# Patient Record
Sex: Male | Born: 1980 | Race: Black or African American | Hispanic: No | Marital: Single | Smoking: Current every day smoker
Health system: Southern US, Community
[De-identification: ages and names within clinical notes are randomized; demographics above are authoritative.]

## PROBLEM LIST (undated history)

## (undated) HISTORY — PX: APPENDECTOMY: SHX54

---

## 2006-01-02 ENCOUNTER — Emergency Department: Payer: Self-pay

## 2010-05-01 ENCOUNTER — Emergency Department: Payer: Self-pay | Admitting: Emergency Medicine

## 2013-06-08 ENCOUNTER — Emergency Department: Payer: Self-pay | Admitting: Emergency Medicine

## 2013-06-18 ENCOUNTER — Emergency Department: Payer: Self-pay

## 2015-07-05 ENCOUNTER — Other Ambulatory Visit: Payer: Self-pay | Admitting: Family Medicine

## 2015-07-05 ENCOUNTER — Ambulatory Visit
Admission: RE | Admit: 2015-07-05 | Discharge: 2015-07-05 | Disposition: A | Discharge status: Home / Self Care | Payer: Disability Insurance | Source: Ambulatory Visit | Attending: Family Medicine | Admitting: Family Medicine

## 2015-07-05 DIAGNOSIS — M545 Low back pain: Secondary | ICD-10-CM

## 2018-10-17 ENCOUNTER — Other Ambulatory Visit: Payer: Self-pay

## 2018-10-17 ENCOUNTER — Emergency Department
Admission: EM | Admit: 2018-10-17 | Discharge: 2018-10-17 | Disposition: A | Discharge status: Home / Self Care | Payer: Disability Insurance | Attending: Emergency Medicine | Admitting: Emergency Medicine

## 2018-10-17 DIAGNOSIS — Z5189 Encounter for other specified aftercare: Secondary | ICD-10-CM

## 2018-10-17 DIAGNOSIS — Z029 Encounter for administrative examinations, unspecified: Secondary | ICD-10-CM | POA: Insufficient documentation

## 2018-10-17 MED ORDER — MUPIROCIN 2 % EX OINT: TOPICAL_OINTMENT | CUTANEOUS | 0 refills | Status: AC

## 2018-10-17 NOTE — ED Notes (Signed)
Pt informed triage RN multiple times that he needs to make sure he has a work note for tomorrow.

## 2018-10-17 NOTE — ED Provider Notes (Signed)
[facility] Emergency Department Provider Note  ____________________________________________  Time seen: Approximately 11:40 PM  I have reviewed the triage vital signs and the nursing notes.   HISTORY  Chief Complaint Laceration    HPI Danny Herrera is a 38 y.o. male presents to the emergency department with a fissure in the skin between the left fourth and fifth toes.  Patient reports that he "stepped on something" while getting ready in the bathroom this morning.  Patient states that it is been irritating him and has been difficult to stand at his place of work, [employer].  Patient states he would like a work note.        No past medical history on file.  There are no active problems to display for this patient.   Prior to Admission medications   Medication Sig Start Date End Date Taking? Authorizing Provider  mupirocin ointment (BACTROBAN) 2 % Apply to affected area 3 times daily 10/17/18 10/17/19  Orvil FeilWoods, Nevelyn Mellott M, PA-C    Allergies Patient has no known allergies.  No family history on file.  Social History Social History   Tobacco Use  . Smoking status: Not on file  Substance Use Topics  . Alcohol use: Not on file  . Drug use: Not on file     Review of Systems  Constitutional: No fever/chills Eyes: No visual changes. No discharge ENT: No upper respiratory complaints. Cardiovascular: no chest pain. Respiratory: no cough. No SOB. Gastrointestinal: No abdominal pain.  No nausea, no vomiting.  No diarrhea.  No constipation. Musculoskeletal: Negative for musculoskeletal pain. Skin: Patient has fissure between left fourth and fifth toes. Neurological: Negative for headaches, focal weakness or numbness.   ____________________________________________   PHYSICAL EXAM:  VITAL SIGNS: ED Triage Vitals  Enc Vitals Group     BP 10/17/18 2155 107/67     Pulse Rate 10/17/18 2155 87     Resp 10/17/18 2155 16     Temp 10/17/18 2155 98.7 F  (37.1 C)     Temp Source 10/17/18 2155 Oral     SpO2 10/17/18 2155 96 %     Weight 10/17/18 2155 165 lb (74.8 kg)     Height 10/17/18 2155 5\' 6"  (1.676 m)     Head Circumference --      Peak Flow --      Pain Score 10/17/18 2156 8     Pain Loc --      Pain Edu? --      Excl. in [place]? --      Constitutional: Alert and oriented. Well appearing and in no acute distress. Eyes: Conjunctivae are normal. PERRL. EOMI. Head: Atraumatic. Cardiovascular: Normal rate, regular rhythm. Normal S1 and S2.  Good peripheral circulation. Respiratory: Normal respiratory effort without tachypnea or retractions. Lungs CTAB. Good air entry to the bases with no decreased or absent breath sounds. Musculoskeletal: Full range of motion to all extremities. No gross deformities appreciated. Neurologic:  Normal speech and language. No gross focal neurologic deficits are appreciated.  Skin: Patient has fissure between left fourth and fifth toes. Psychiatric: Mood and affect are normal. Speech and behavior are normal. Patient exhibits appropriate insight and judgement.   ____________________________________________   LABS (all labs ordered are listed, but only abnormal results are displayed)  Labs Reviewed - No data to display ____________________________________________  EKG   ____________________________________________  RADIOLOGY   No results found.  ____________________________________________    PROCEDURES  Procedure(s) performed:    Procedures  Medications - No data to display   ____________________________________________   INITIAL IMPRESSION / ASSESSMENT AND PLAN / ED COURSE  Pertinent labs & imaging results that were available during my care of the patient were reviewed by me and considered in my medical decision making (see chart for details).  Review of the [place] CSRS was performed in accordance of the [place] prior to dispensing any controlled drugs.            Assessment and plan Wound check Patient presents to the emergency department for wound check between left fourth and fifth toes.  A small fissure is visualized on physical exam between left fourth and fifth toes.  Patient was prescribed mupirocin ointment and given work note per his request.  All patient questions were answered.  ____________________________________________  FINAL CLINICAL IMPRESSION(S) / ED DIAGNOSES  Final diagnoses:  Visit for wound check      NEW MEDICATIONS STARTED DURING THIS VISIT:  ED Discharge Orders         Ordered    mupirocin ointment (BACTROBAN) 2 %     10/17/18 2324              This chart was dictated using voice recognition software/Dragon. Despite best efforts to proofread, errors can occur which can change the meaning. Any change was purely unintentional.    Lannie Fields, PA-C 10/17/18 2344    Nance Pear, MD 10/18/18 [phone]

## 2018-10-17 NOTE — ED Triage Notes (Signed)
Pt complains of left foot "laceration". Pt has what appears to be an open blister or callous on left foot at base of left fifth toe.

## 2018-11-07 ENCOUNTER — Other Ambulatory Visit: Payer: Self-pay

## 2018-11-07 ENCOUNTER — Emergency Department
Admission: EM | Admit: 2018-11-07 | Discharge: 2018-11-07 | Discharge status: Against Medical Advice | Payer: Disability Insurance

## 2019-04-18 ENCOUNTER — Emergency Department
Admission: EM | Admit: 2019-04-18 | Discharge: 2019-04-19 | Disposition: A | Discharge status: Home / Self Care | Payer: PRIVATE HEALTH INSURANCE | Attending: Emergency Medicine | Admitting: Emergency Medicine

## 2019-04-18 ENCOUNTER — Encounter: Payer: Self-pay | Admitting: Emergency Medicine

## 2019-04-18 ENCOUNTER — Emergency Department: Payer: PRIVATE HEALTH INSURANCE

## 2019-04-18 ENCOUNTER — Other Ambulatory Visit: Payer: Self-pay

## 2019-04-18 DIAGNOSIS — S46812A Strain of other muscles, fascia and tendons at shoulder and upper arm level, left arm, initial encounter: Secondary | ICD-10-CM | POA: Diagnosis not present

## 2019-04-18 DIAGNOSIS — Y990 Civilian activity done for income or pay: Secondary | ICD-10-CM | POA: Insufficient documentation

## 2019-04-18 DIAGNOSIS — Y93G1 Activity, food preparation and clean up: Secondary | ICD-10-CM | POA: Insufficient documentation

## 2019-04-18 DIAGNOSIS — Y9259 Other trade areas as the place of occurrence of the external cause: Secondary | ICD-10-CM | POA: Diagnosis not present

## 2019-04-18 DIAGNOSIS — F17210 Nicotine dependence, cigarettes, uncomplicated: Secondary | ICD-10-CM | POA: Diagnosis not present

## 2019-04-18 DIAGNOSIS — X500XXA Overexertion from strenuous movement or load, initial encounter: Secondary | ICD-10-CM | POA: Insufficient documentation

## 2019-04-18 DIAGNOSIS — M542 Cervicalgia: Secondary | ICD-10-CM | POA: Insufficient documentation

## 2019-04-18 DIAGNOSIS — S4992XA Unspecified injury of left shoulder and upper arm, initial encounter: Secondary | ICD-10-CM | POA: Diagnosis present

## 2019-04-18 DIAGNOSIS — T148XXA Other injury of unspecified body region, initial encounter: Secondary | ICD-10-CM

## 2019-04-18 MED ORDER — METHOCARBAMOL 500 MG PO TABS
500.0000 mg | ORAL_TABLET | Freq: Once | ORAL | Status: AC
  Administered 2019-04-19: 500 mg via ORAL
  Filled 2019-04-18: qty 1

## 2019-04-18 MED ORDER — METHOCARBAMOL 500 MG PO TABS: 1000.0000 mg | ORAL_TABLET | Freq: Once | ORAL | Status: DC

## 2019-04-18 MED ORDER — KETOROLAC TROMETHAMINE 30 MG/ML IJ SOLN
30.0000 mg | Freq: Once | INTRAMUSCULAR | Status: AC
  Administered 2019-04-18: 30 mg via INTRAMUSCULAR
  Filled 2019-04-18: qty 1

## 2019-04-18 MED ORDER — IBUPROFEN 600 MG PO TABS: 600.0000 mg | ORAL_TABLET | Freq: Four times a day (QID) | ORAL | 0 refills | Status: DC | PRN

## 2019-04-18 MED ORDER — CYCLOBENZAPRINE HCL 5 MG PO TABS: ORAL_TABLET | ORAL | 0 refills | Status: DC

## 2019-04-18 MED ORDER — ORPHENADRINE CITRATE 30 MG/ML IJ SOLN: 60.0000 mg | Freq: Two times a day (BID) | INTRAMUSCULAR | Status: DC

## 2019-04-18 MED ORDER — OXYCODONE-ACETAMINOPHEN 5-325 MG PO TABS
1.0000 | ORAL_TABLET | Freq: Once | ORAL | Status: AC
  Administered 2019-04-19: 1 via ORAL
  Filled 2019-04-18: qty 1

## 2019-04-18 MED ORDER — LIDOCAINE 5 % EX PTCH
1.0000 | MEDICATED_PATCH | CUTANEOUS | Status: DC
  Administered 2019-04-18: 23:00:00 1 via TRANSDERMAL
  Filled 2019-04-18: qty 1

## 2019-04-18 MED ORDER — LIDOCAINE 5 % EX PTCH: 1.0000 | MEDICATED_PATCH | CUTANEOUS | 0 refills | Status: DC

## 2019-04-18 MED ORDER — TRAMADOL HCL 50 MG PO TABS: 50.0000 mg | ORAL_TABLET | Freq: Three times a day (TID) | ORAL | 0 refills | Status: AC | PRN

## 2019-04-18 NOTE — ED Notes (Signed)
This tech was in the process of workers comp paper work. Pt went to bathroom to provide urine sample and brought it back to this tech in which the thermometer strip on the specimen cup did not change colors and urine felt cold. Rebecca,RN looked at specimen cup and saw the same findings as this tech. Pt made aware that we could not accept that urine specimen and that we would need to restart the process. Pt being taken to XR at this time.

## 2019-04-18 NOTE — ED Notes (Signed)
Pt informed RN that he was ready to repeat WC. This tech went to pt rm and pt stated "Can I actually have a cup of water? I am trying to work myself up to be able to pee." Pt provided with a cup of water per request. Pt also made aware that this time someone will have to stay in the bathroom to observe him urinating d/t results from first urine specimen. Pt stated "that is fine." Will inform oncoming tech of situation.

## 2019-04-18 NOTE — ED Triage Notes (Signed)
Pt reports he was at work lifted a box approximately 65lbs reports was able to lift and move to a different area after this reports he started to have left clavicle and shoulder pain. Pt talks in complete sentences no distress noted.

## 2019-04-18 NOTE — ED Provider Notes (Signed)
[facility] Emergency Department Provider Note  ____________________________________________  Time seen: Approximately 10:56 PM  I have reviewed the triage vital signs and the nursing notes.   HISTORY  Chief Complaint Shoulder Pain and Clavicle Injury    HPI Danny Herrera is a 39 y.o. male that presents to the emergency department for evaluation of left-sided neck pain tonight.  Patient states that he was at work and working [employer] today.  Patient states that he has the exact same routine every day.  He was cleaning up around 7 PM and was cleaning the crumbs out of the fry machine.  He lifted about 65 pounds and felt a sharp pain to the left side of his neck "where his neck meets his shoulder."  Right after the injury, he did feel some dizziness but this resolved shortly after.  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  No headache, visual changes, shortness of breath.  History reviewed. No pertinent past medical history.  There are no problems to display for this patient.   Past Surgical History:  Procedure Laterality Date  . APPENDECTOMY      Prior to Admission medications   Medication Sig Start Date End Date Taking? Authorizing Provider  cyclobenzaprine (FLEXERIL) 5 MG tablet Take 1-2 tablets 3 times daily as needed 04/18/19   Laban Emperor, PA-C  ibuprofen (ADVIL) 600 MG tablet Take 1 tablet (600 mg total) by mouth every 6 (six) hours as needed. 04/18/19   Laban Emperor, PA-C  lidocaine (LIDODERM) 5 % Place 1 patch onto the skin daily. Remove & Discard patch within 12 hours or as directed by MD 04/18/19   Laban Emperor, PA-C  mupirocin ointment Drue Stager) 2 % Apply to affected area 3 times daily 10/17/18 10/17/19  Lannie Fields, PA-C  traMADol (ULTRAM) 50 MG tablet Take 1 tablet (50 mg total) by mouth every 8 (eight) hours as needed for up to 2 days. 04/18/19 04/20/19  Laban Emperor, PA-C    Allergies Patient has no known  allergies.  No family history on file.  Social History Social History   Tobacco Use  . Smoking status: Current Every Day Smoker  Substance Use Topics  . Alcohol use: Yes  . Drug use: Not Currently     Review of Systems  Cardiovascular: No chest pain. Respiratory: No cough. No SOB. Gastrointestinal: No abdominal pain.  No nausea, no vomiting.  Musculoskeletal: Positive for neck/shoulder pain. Skin: Negative for rash, abrasions, lacerations, ecchymosis. Neurological: Negative for headaches   ____________________________________________   PHYSICAL EXAM:  VITAL SIGNS: ED Triage Vitals  Enc Vitals Group     BP 04/18/19 2058 (!) 119/92     Pulse Rate 04/18/19 2058 90     Resp 04/18/19 2058 20     Temp 04/18/19 2058 98 F (36.7 C)     Temp Source 04/18/19 2058 Oral     SpO2 --      Weight 04/18/19 2059 165 lb (74.8 kg)     Height 04/18/19 2059 5\' 6"  (1.676 m)     Head Circumference --      Peak Flow --      Pain Score 04/18/19 2058 10     Pain Loc --      Pain Edu? --      Excl. in [place]? --      Constitutional: Alert and oriented. Well appearing and in no acute distress. Eyes: Conjunctivae are normal. PERRL. EOMI. Head:  Atraumatic. ENT:      Ears:      Nose: No congestion/rhinnorhea.      Mouth/Throat: Mucous membranes are moist.  Neck: No stridor. No cervical spine tenderness to palpation.  No tenderness to palpation to left or right trapezius. Cardiovascular: Normal rate, regular rhythm.  Good peripheral circulation.  Symmetric radial pulses bilaterally. Respiratory: Normal respiratory effort without tachypnea or retractions. Lungs CTAB. Good air entry to the bases with no decreased or absent breath sounds. Musculoskeletal: No gross deformities appreciated.  No tenderness to palpation to [facility] joint. Point tenderness to palpation superior to mid clavicle. Pain with right rotation of neck. Limited ROM of right shoulder due to pain.  Neurologic:  Normal speech and  language. No gross focal neurologic deficits are appreciated.  Skin:  Skin is warm, dry and intact. No rash noted. Psychiatric: Mood and affect are normal. Speech and behavior are normal. Patient exhibits appropriate insight and judgement.   ____________________________________________   LABS (all labs ordered are listed, but only abnormal results are displayed)  Labs Reviewed - No data to display ____________________________________________  EKG   ____________________________________________  RADIOLOGY Lexine Baton, personally viewed and evaluated these images (plain radiographs) as part of my medical decision making, as well as reviewing the written report by the radiologist.  DG Shoulder Left  Result Date: 04/18/2019 CLINICAL DATA:  Left shoulder pain after lifting EXAM: LEFT SHOULDER - 2+ VIEW COMPARISON:  None. FINDINGS: There is no evidence of fracture or dislocation. There is no evidence of arthropathy or other focal bone abnormality. Soft tissues are unremarkable. IMPRESSION: Negative. Electronically Signed   By: Jasmine Pang M.D.   On: 04/18/2019 21:31    ____________________________________________    PROCEDURES  Procedure(s) performed:    Procedures    Medications  lidocaine (LIDODERM) 5 % 1 patch (1 patch Transdermal Patch Applied 04/18/19 2308)  methocarbamol (ROBAXIN) tablet 500 mg (has no administration in time range)  oxyCODONE-acetaminophen (PERCOCET/ROXICET) 5-325 MG per tablet 1 tablet (has no administration in time range)  ketorolac (TORADOL) 30 MG/ML injection 30 mg (30 mg Intramuscular Given 04/18/19 2310)     ____________________________________________   INITIAL IMPRESSION / ASSESSMENT AND PLAN / ED COURSE  Pertinent labs & imaging results that were available during my care of the patient were reviewed by me and considered in my medical decision making (see chart for details).  Review of the [place] CSRS was performed in accordance of the [facility]  prior to dispensing any controlled drugs.   Patient's diagnosis is consistent with musculoskeletal injury. Xray negative for acute bony abnormalities. Pain significantly improved with pain medications.  Patient is now able to move around his neck and his left shoulder with minimal pain.  Patient will be discharged home with prescriptions for flexeril, motrin, and a short course of tramadol. Patient is to follow up with PCP as directed. Referal was given. Patient is given ED precautions to return to the ED for any worsening or new symptoms.  Danny Herrera was evaluated in Emergency Department on 04/18/2019 for the symptoms described in the history of present illness. He was evaluated in the context of the global COVID-19 pandemic, which necessitated consideration that the patient might be at risk for infection with the SARS-CoV-2 virus that causes COVID-19. Institutional protocols and algorithms that pertain to the evaluation of patients at risk for COVID-19 are in a state of rapid change based on information released by regulatory bodies including the CDC and federal and state organizations. These  policies and algorithms were followed during the patient's care in the ED.   ____________________________________________  FINAL CLINICAL IMPRESSION(S) / ED DIAGNOSES  Final diagnoses:  Muscle strain      NEW MEDICATIONS STARTED DURING THIS VISIT:  ED Discharge Orders         Ordered    ibuprofen (ADVIL) 600 MG tablet  Every 6 hours PRN     04/18/19 2329    cyclobenzaprine (FLEXERIL) 5 MG tablet     04/18/19 2329    lidocaine (LIDODERM) 5 %  Every 24 hours     04/18/19 2329    traMADol (ULTRAM) 50 MG tablet  Every 8 hours PRN     04/18/19 2329              This chart was dictated using voice recognition software/Dragon. Despite best efforts to proofread, errors can occur which can change the meaning. Any change was purely unintentional.    Enid Derry, PA-C 04/18/19 2331     Emily Filbert, MD 04/18/19 2352

## 2019-04-19 NOTE — ED Notes (Signed)
Attempted to get urine sample for WC. PT unable to provide second sample after. PT signed paperwork giving consent and also stating that he understands the specimen and lack thereof second specimen did not meet testing criteria. Witnessed by [place], [place]. PT discharged.

## 2019-07-11 ENCOUNTER — Emergency Department: Payer: Medicaid Other

## 2019-07-11 ENCOUNTER — Encounter: Payer: Self-pay | Admitting: Emergency Medicine

## 2019-07-11 ENCOUNTER — Other Ambulatory Visit: Payer: Self-pay

## 2019-07-11 ENCOUNTER — Emergency Department
Admission: EM | Admit: 2019-07-11 | Discharge: 2019-07-11 | Disposition: A | Discharge status: Home / Self Care | Payer: Medicaid Other | Attending: Emergency Medicine | Admitting: Emergency Medicine

## 2019-07-11 DIAGNOSIS — Y939 Activity, unspecified: Secondary | ICD-10-CM | POA: Insufficient documentation

## 2019-07-11 DIAGNOSIS — W01110A Fall on same level from slipping, tripping and stumbling with subsequent striking against sharp glass, initial encounter: Secondary | ICD-10-CM | POA: Insufficient documentation

## 2019-07-11 DIAGNOSIS — Y999 Unspecified external cause status: Secondary | ICD-10-CM | POA: Insufficient documentation

## 2019-07-11 DIAGNOSIS — Z79899 Other long term (current) drug therapy: Secondary | ICD-10-CM | POA: Insufficient documentation

## 2019-07-11 DIAGNOSIS — Y929 Unspecified place or not applicable: Secondary | ICD-10-CM | POA: Insufficient documentation

## 2019-07-11 DIAGNOSIS — R109 Unspecified abdominal pain: Secondary | ICD-10-CM | POA: Insufficient documentation

## 2019-07-11 DIAGNOSIS — F17210 Nicotine dependence, cigarettes, uncomplicated: Secondary | ICD-10-CM | POA: Insufficient documentation

## 2019-07-11 DIAGNOSIS — S31119A Laceration without foreign body of abdominal wall, unspecified quadrant without penetration into peritoneal cavity, initial encounter: Secondary | ICD-10-CM

## 2019-07-11 LAB — CBC WITH DIFFERENTIAL/PLATELET
Abs Immature Granulocytes: 0.02 10*3/uL (ref 0.00–0.07)
Basophils Absolute: 0 10*3/uL (ref 0.0–0.1)
Basophils Relative: 0 %
Eosinophils Absolute: 0.1 10*3/uL (ref 0.0–0.5)
Eosinophils Relative: 2 %
HCT: 41.6 % (ref 39.0–52.0)
Hemoglobin: 14.1 g/dL (ref 13.0–17.0)
Immature Granulocytes: 0 %
Lymphocytes Relative: 29 %
Lymphs Abs: 2.3 10*3/uL (ref 0.7–4.0)
MCH: 31.4 pg (ref 26.0–34.0)
MCHC: 33.9 g/dL (ref 30.0–36.0)
MCV: 92.7 fL (ref 80.0–100.0)
Monocytes Absolute: 0.5 10*3/uL (ref 0.1–1.0)
Monocytes Relative: 6 %
Neutro Abs: 4.7 10*3/uL (ref 1.7–7.7)
Neutrophils Relative %: 63 %
Platelets: 325 10*3/uL (ref 150–400)
RBC: 4.49 MIL/uL (ref 4.22–5.81)
RDW: 13.5 % (ref 11.5–15.5)
WBC: 7.7 10*3/uL (ref 4.0–10.5)
nRBC: 0 % (ref 0.0–0.2)

## 2019-07-11 LAB — BASIC METABOLIC PANEL
Anion gap: 10 (ref 5–15)
BUN: 14 mg/dL (ref 6–20)
CO2: 21 mmol/L — ABNORMAL LOW (ref 22–32)
Calcium: 9 mg/dL (ref 8.9–10.3)
Chloride: 105 mmol/L (ref 98–111)
Creatinine, Ser: 0.9 mg/dL (ref 0.61–1.24)
GFR calc Af Amer: >60 mL/min (ref >60)
GFR calc non Af Amer: >60 mL/min (ref >60)
Glucose, Bld: 94 mg/dL (ref 70–99)
Potassium: 3.6 mmol/L (ref 3.5–5.1)
Sodium: 136 mmol/L (ref 135–145)

## 2019-07-11 MED ORDER — IOHEXOL 300 MG/ML  SOLN
100.0000 mL | Freq: Once | INTRAMUSCULAR | Status: AC | PRN
  Administered 2019-07-11: 100 mL via INTRAVENOUS

## 2019-07-11 MED ORDER — FENTANYL CITRATE (PF) 100 MCG/2ML IJ SOLN
50.0000 ug | Freq: Once | INTRAMUSCULAR | Status: AC
  Administered 2019-07-11: 50 ug via INTRAVENOUS
  Filled 2019-07-11: qty 2

## 2019-07-11 MED ORDER — LIDOCAINE HCL (PF) 1 % IJ SOLN
5.0000 mL | Freq: Once | INTRAMUSCULAR | Status: DC
  Filled 2019-07-11: qty 5

## 2019-07-11 NOTE — Discharge Instructions (Signed)
Keep laceration dry and clean. Wash with warm water and soap. Apply topical bacitracin. Protect from the sun to minimize scarring. Cover it with SPF 70 or higher and use hat when out in the sun for 6-9 months. You received 4 stitches that must be removed in 7 days.  Watch for signs of infection: pus, redness of the skin surrounding it, or fever. If these develop see your doctor or return to the ER for antibiotics.  

## 2019-07-11 NOTE — ED Triage Notes (Signed)
Patient states that he was drinking a beer and slipped and fell. Patient states that he fell on the glass bottle and that it cut his right side. Patient with laceration to right side.

## 2019-07-11 NOTE — ED Provider Notes (Signed)
[facility] Emergency Department Provider Note  ____________________________________________  Time seen: Approximately 7:22 AM  I have reviewed the triage vital signs and the nursing notes.   HISTORY  Chief Complaint Laceration   HPI Danny Herrera is a 39 y.o. male who presents for evaluation of a laceration.  Patient was drinking tonight.  Tripped and fell onto a broken beer bottle and sustained a laceration to his right flank.  Patient reports that that happened an hour ago when he has been bleeding profusely from it.  Is not on blood thinners.  He denies any abdominal pain.  No head trauma or LOC.  Patient is complaining of severe pain located in his right flank which has been present since the fall.   PMH None   Past Surgical History:  Procedure Laterality Date  . APPENDECTOMY      Prior to Admission medications   Medication Sig Start Date End Date Taking? Authorizing Provider  cyclobenzaprine (FLEXERIL) 5 MG tablet Take 1-2 tablets 3 times daily as needed 04/18/19   Laban Emperor, PA-C  ibuprofen (ADVIL) 600 MG tablet Take 1 tablet (600 mg total) by mouth every 6 (six) hours as needed. 04/18/19   Laban Emperor, PA-C  lidocaine (LIDODERM) 5 % Place 1 patch onto the skin daily. Remove & Discard patch within 12 hours or as directed by MD 04/18/19   Laban Emperor, PA-C  mupirocin ointment Drue Stager) 2 % Apply to affected area 3 times daily 10/17/18 10/17/19  Lannie Fields, PA-C    Allergies Patient has no known allergies.  No family history on file.  Social History Social History   Tobacco Use  . Smoking status: Current Every Day Smoker  . Smokeless tobacco: Never Used  Substance Use Topics  . Alcohol use: Yes  . Drug use: Yes    Types: Marijuana    Review of Systems  Constitutional: Negative for fever. Eyes: Negative for visual changes. ENT: Negative for sore throat. Neck: No neck pain  Cardiovascular: Negative for chest  pain. Respiratory: Negative for shortness of breath. Gastrointestinal: Negative for abdominal pain, vomiting or diarrhea. Genitourinary: Negative for dysuria. Musculoskeletal: Negative for back pain. Skin: Negative for rash. + flank laceration Neurological: Negative for headaches, weakness or numbness. Psych: No SI or HI  ____________________________________________   PHYSICAL EXAM:  VITAL SIGNS: ED Triage Vitals  Enc Vitals Group     BP 07/11/19 0510 118/62     Pulse Rate 07/11/19 0510 85     Resp 07/11/19 0510 18     Temp 07/11/19 0510 98.3 F (36.8 C)     Temp Source 07/11/19 0510 Oral     SpO2 07/11/19 0510 97 %     Weight 07/11/19 0509 165 lb (74.8 kg)     Height 07/11/19 0509 5\' 6"  (1.676 m)     Head Circumference --      Peak Flow --      Pain Score 07/11/19 0508 7     Pain Loc --      Pain Edu? --      Excl. in [place]? --     Constitutional: Alert and oriented, smells like alcohol, intoxicated, belligerent.  HEENT:      Head: Normocephalic and atraumatic.         Eyes: Conjunctivae are normal. Sclera is non-icteric.       Mouth/Throat: Mucous membranes are moist.       Neck: Supple with no signs of meningismus. Cardiovascular: Regular rate  and rhythm. No murmurs, gallops, or rubs. Respiratory: Normal respiratory effort. Lungs are clear to auscultation bilaterally. Gastrointestinal: Soft, non tender, and non distended Genitourinary: R flank tenderness Musculoskeletal: Nontender with normal range of motion in all extremities.  Neurologic: Normal speech and language. Face is symmetric. Moving all extremities. No gross focal neurologic deficits are appreciated. Skin: 6 cm laceration over the R flank. Skin is warm, dry and intact. No rash noted. Psychiatric: Mood and affect are normal. Speech and behavior are normal.  ____________________________________________   LABS (all labs ordered are listed, but only abnormal results are displayed)  Labs Reviewed  BASIC  METABOLIC PANEL - Abnormal; Notable for the following components:      Result Value   CO2 21 (*)    All other components within normal limits  CBC WITH DIFFERENTIAL/PLATELET   ____________________________________________  EKG  none  ____________________________________________  RADIOLOGY  I have personally reviewed the images performed during this visit and I agree with the Radiologist's read.   Interpretation by Radiologist:  CT Chest W Contrast  Result Date: 07/11/2019 CLINICAL DATA:  Abdominal pain, status post fall EXAM: CT CHEST, ABDOMEN, AND PELVIS WITH CONTRAST TECHNIQUE: Multidetector CT imaging of the chest, abdomen and pelvis was performed following the standard protocol during bolus administration of intravenous contrast. CONTRAST:  OMNIPAQUE IOHEXOL 300 MG/ML  SOLN COMPARISON:  None. FINDINGS: CT CHEST FINDINGS Cardiovascular: No significant vascular findings. Normal heart size. No pericardial effusion. Mediastinum/Nodes: No enlarged mediastinal, hilar, or axillary lymph nodes. Thyroid gland, trachea, and esophagus demonstrate no significant findings. Lungs/Pleura: Biapical blebs. Linear band of airspace disease in the right upper lobe consistent with scarring. No focal consolidation, pleural effusion or pneumothorax. Musculoskeletal: No acute osseous abnormality. No aggressive osseous lesion small amount of air in the sternoclavicular joints bilaterally likely secondary to mild degenerative changes. CT ABDOMEN PELVIS FINDINGS Hepatobiliary: No focal liver abnormality is seen. No gallstones, gallbladder wall thickening, or biliary dilatation. Pancreas: Unremarkable. No pancreatic ductal dilatation or surrounding inflammatory changes. Spleen: Normal in size without focal abnormality. Adrenals/Urinary Tract: Adrenal glands are unremarkable. Kidneys are normal, without renal calculi, focal lesion, or hydronephrosis. Bladder is unremarkable. Stomach/Bowel: Stomach is within normal  limits. Appendix appears normal. No evidence of bowel wall thickening, distention, or inflammatory changes. Vascular/Lymphatic: No significant vascular findings are present. No enlarged abdominal or pelvic lymph nodes. Reproductive: Prostate is unremarkable. Other: No abdominal wall hernia or abnormality. No abdominopelvic ascites. Small wound along the right lateral aspect of the lower chest and upper abdomen without penetration into the thoracic or abdominal cavity. 3 mm punctate radiopaque foreign body in the skin along the right lateral upper abdomen. Musculoskeletal: No acute osseous abnormality. No aggressive osseous lesion. IMPRESSION: 1. Small wound along the right lateral aspect of the lower chest and upper abdomen without penetration into the thoracic or abdominal cavity. 3 mm punctate radiopaque foreign body in the skin along the right lateral upper abdomen. 2. Otherwise, no acute injury of the chest, abdomen or pelvis. Electronically Signed   By: Elige Ko   On: 07/11/2019 06:27   CT ABDOMEN PELVIS W CONTRAST  Addendum Date: 07/11/2019   ADDENDUM REPORT: 07/11/2019 06:26 ADDENDUM: 1. Small wound along the right lateral aspect of the lower chest and upper abdomen without penetration into the thoracic or abdominal cavity. 3 mm punctate radiopaque foreign body in the skin along the right lateral upper abdomen. 2. Otherwise, no acute injury of the chest, abdomen or pelvis. Electronically Signed   By: Alan Ripper  Patel   On: 07/11/2019 06:26   Result Date: 07/11/2019 CLINICAL DATA:  Abdominal pain, status post fall EXAM: CT CHEST, ABDOMEN, AND PELVIS WITH CONTRAST TECHNIQUE: Multidetector CT imaging of the chest, abdomen and pelvis was performed following the standard protocol during bolus administration of intravenous contrast. CONTRAST:  OMNIPAQUE IOHEXOL 300 MG/ML  SOLN COMPARISON:  None. FINDINGS: CT CHEST FINDINGS Cardiovascular: No significant vascular findings. Normal heart size. No  pericardial effusion. Mediastinum/Nodes: No enlarged mediastinal, hilar, or axillary lymph nodes. Thyroid gland, trachea, and esophagus demonstrate no significant findings. Lungs/Pleura: Biapical blebs. Linear band of airspace disease in the right upper lobe consistent with scarring. No focal consolidation, pleural effusion or pneumothorax. Musculoskeletal: No acute osseous abnormality. No aggressive osseous lesion small amount of air in the sternoclavicular joints bilaterally likely secondary to mild degenerative changes. CT ABDOMEN PELVIS FINDINGS Hepatobiliary: No focal liver abnormality is seen. No gallstones, gallbladder wall thickening, or biliary dilatation. Pancreas: Unremarkable. No pancreatic ductal dilatation or surrounding inflammatory changes. Spleen: Normal in size without focal abnormality. Adrenals/Urinary Tract: Adrenal glands are unremarkable. Kidneys are normal, without renal calculi, focal lesion, or hydronephrosis. Bladder is unremarkable. Stomach/Bowel: Stomach is within normal limits. Appendix appears normal. No evidence of bowel wall thickening, distention, or inflammatory changes. Vascular/Lymphatic: No significant vascular findings are present. No enlarged abdominal or pelvic lymph nodes. Reproductive: Prostate is unremarkable. Other: No abdominal wall hernia or abnormality. No abdominopelvic ascites. Musculoskeletal: No acute or significant osseous findings. IMPRESSION: 1. No acute injury of the chest, abdomen or pelvis. 2. Biapical blebs. Electronically Signed: By: Elige Ko On: 07/11/2019 06:09     ____________________________________________   PROCEDURES  Procedure(s) performed: yes .Marland KitchenLaceration Repair  Date/Time: 07/11/2019 7:29 AM Performed by: Nita Sickle, MD Authorized by: Nita Sickle, MD   Consent:    Consent obtained:  Verbal   Consent given by:  Patient   Risks discussed:  Infection, pain, retained foreign body, poor cosmetic result and poor  wound healing Anesthesia (see MAR for exact dosages):    Anesthesia method:  Local infiltration   Local anesthetic:  Lidocaine 1% w/o epi Laceration details:    Location:  Trunk   Trunk location:  R flank   Length (cm):  6 Repair type:    Repair type:  Simple Pre-procedure details:    Preparation:  Patient was prepped and draped in usual sterile fashion and imaging obtained to evaluate for foreign bodies Exploration:    Hemostasis achieved with:  Direct pressure   Wound exploration: entire depth of wound probed and visualized     Wound extent: no fascia violation noted and no underlying fracture noted     Contaminated: no   Treatment:    Area cleansed with:  Saline and Betadine   Amount of cleaning:  Extensive   Irrigation solution:  Sterile saline   Visualized foreign bodies/material removed: no   Skin repair:    Repair method:  Sutures   Suture size:  3-0   Suture material:  Prolene   Suture technique:  Simple interrupted   Number of sutures:  4 Approximation:    Approximation:  Close Post-procedure details:    Dressing:  Sterile dressing   Patient tolerance of procedure:  Tolerated well, no immediate complications   Critical Care performed:  None ____________________________________________   INITIAL IMPRESSION / ASSESSMENT AND PLAN / ED COURSE  39 y.o. male who presents for evaluation of a flank laceration after falling over a broken glass bottle.  Patient arrives intoxicated, belligerent, with  a sick centimeter laceration over his right flank.  Exam was limited due to patient's mental status.  He was sent for a CT chest, abdomen and pelvis to rule out a deep injury.  Laceration did not seem to penetrate into the thoracic or abdominal cavity.  Labs were within normal limits.  Last tetanus shot was 3 years ago per patient.  Laceration was repaired per procedure note above.  Wound care and stitch removal was discussed with patient and his wife was in the room at time of  discharge.  Discussed return precautions for any signs of infection.  I have reviewed patient's previous medical records and PMH.       _____________________________________________ Please note:  Patient was evaluated in Emergency Department today for the symptoms described in the history of present illness. Patient was evaluated in the context of the global COVID-19 pandemic, which necessitated consideration that the patient might be at risk for infection with the SARS-CoV-2 virus that causes COVID-19. Institutional protocols and algorithms that pertain to the evaluation of patients at risk for COVID-19 are in a state of rapid change based on information released by regulatory bodies including the CDC and federal and state organizations. These policies and algorithms were followed during the patient's care in the ED.  Some ED evaluations and interventions may be delayed as a result of limited staffing during the pandemic.   ____________________________________________   FINAL CLINICAL IMPRESSION(S) / ED DIAGNOSES   Final diagnoses:  Laceration of right flank, initial encounter      NEW MEDICATIONS STARTED DURING THIS VISIT:  ED Discharge Orders    None       Note:  This document was prepared using Dragon voice recognition software and may include unintentional dictation errors.    Don Perking, [place], MD 07/11/19 0730

## 2020-05-21 ENCOUNTER — Encounter: Payer: Self-pay | Admitting: Intensive Care

## 2020-05-21 ENCOUNTER — Emergency Department
Admission: EM | Admit: 2020-05-21 | Discharge: 2020-05-21 | Disposition: A | Discharge status: Home / Self Care | Payer: Medicaid Other | Attending: Emergency Medicine | Admitting: Emergency Medicine

## 2020-05-21 ENCOUNTER — Other Ambulatory Visit: Payer: Self-pay

## 2020-05-21 DIAGNOSIS — F17210 Nicotine dependence, cigarettes, uncomplicated: Secondary | ICD-10-CM | POA: Insufficient documentation

## 2020-05-21 DIAGNOSIS — L03012 Cellulitis of left finger: Secondary | ICD-10-CM | POA: Insufficient documentation

## 2020-05-21 MED ORDER — CEPHALEXIN 500 MG PO CAPS: 500.0000 mg | ORAL_CAPSULE | Freq: Three times a day (TID) | ORAL | 0 refills | Status: AC

## 2020-05-21 MED ORDER — TRAMADOL HCL 50 MG PO TABS: 50.0000 mg | ORAL_TABLET | Freq: Four times a day (QID) | ORAL | 0 refills | Status: AC | PRN

## 2020-05-21 NOTE — Discharge Instructions (Addendum)
Soak the finger in warm salt water for 10 to 15 minutes at least twice daily. Take the antibiotic as prescribed Tylenol and ibuprofen for pain, tramadol for pain not controlled by these two medications Return to the emergency department worsening

## 2020-05-21 NOTE — ED Provider Notes (Signed)
[facility] Emergency Department Provider Note  ____________________________________________   Event Date/Time   First MD Initiated Contact with Patient 05/21/20 1628     (approximate)  I have reviewed the triage vital signs and the nursing notes.   HISTORY  Chief Complaint Hand Pain    HPI AEDON DEASON is a 40 y.o. male presents emergency department with swelling and infection is noted around the left thumb cuticle, no fever or chills, symptoms for 3 to 4 days.  No known injury.  Patient states he does not bite his nails.    History reviewed. No pertinent past medical history.  There are no problems to display for this patient.   Past Surgical History:  Procedure Laterality Date  . APPENDECTOMY      Prior to Admission medications   Medication Sig Start Date End Date Taking? Authorizing Provider  cephALEXin (KEFLEX) 500 MG capsule Take 1 capsule (500 mg total) by mouth 3 (three) times daily. 05/21/20  Yes Kaedan Richert, Roselyn Bering, PA-C  traMADol (ULTRAM) 50 MG tablet Take 1 tablet (50 mg total) by mouth every 6 (six) hours as needed. 05/21/20  Yes Faythe Ghee, PA-C    Allergies Patient has no known allergies.  History reviewed. No pertinent family history.  Social History Social History   Tobacco Use  . Smoking status: Current Every Day Smoker    Types: Cigarettes  . Smokeless tobacco: Never Used  Substance Use Topics  . Alcohol use: Yes  . Drug use: Yes    Types: Marijuana    Review of Systems  Constitutional: No fever/chills Eyes: No visual changes. ENT: No sore throat. Respiratory: Denies cough Genitourinary: Negative for dysuria. Musculoskeletal: Negative for back pain. Skin: Negative for rash. Psychiatric: no mood changes,     ____________________________________________   PHYSICAL EXAM:  VITAL SIGNS: ED Triage Vitals [05/21/20 1544]  Enc Vitals Group     BP (!) 106/93     Pulse Rate 92     Resp 16     Temp 97.8 F  (36.6 C)     Temp Source Oral     SpO2 100 %     Weight 165 lb (74.8 kg)     Height 5\' 6"  (1.676 m)     Head Circumference      Peak Flow      Pain Score 10     Pain Loc      Pain Edu?      Excl. in [place]?     Constitutional: Alert and oriented. Well appearing and in no acute distress. Eyes: Conjunctivae are normal.  Head: Atraumatic. Nose: No congestion/rhinnorhea. Mouth/Throat: Mucous membranes are moist.  Neck:  supple no lymphadenopathy noted Cardiovascular: Normal rate, regular rhythm.  Respiratory: Normal respiratory effort.  No retractions, GU: deferred Musculoskeletal: FROM all extremities, warm and well perfused, left thumb has a large paronychia noted, area is very fluctuant Neurologic:  Normal speech and language.  Skin:  Skin is warm, dry and intact. No rash noted. Psychiatric: Mood and affect are normal. Speech and behavior are normal.  ____________________________________________   LABS (all labs ordered are listed, but only abnormal results are displayed)  Labs Reviewed - No data to display ____________________________________________   ____________________________________________  RADIOLOGY    ____________________________________________   PROCEDURES  Procedure(s) performed:   Marland KitchenIncision and Drainage  Date/Time: 05/21/2020 5:19 PM Performed by: 07/19/2020, PA-C Authorized by: Faythe Ghee, PA-C   Consent:    Consent obtained:  Verbal   Consent given by:  Patient   Risks discussed:  Bleeding, incomplete drainage, pain and infection Universal protocol:    Patient identity confirmed:  Verbally with patient Location:    Type:  Abscess   Location:  Upper extremity   Upper extremity location:  Finger   Finger location:  L thumb Pre-procedure details:    Skin preparation:  Antiseptic wash Procedure type:    Complexity:  Simple Procedure details:    Incision types:  Stab incision   Drainage:  Purulent   Drainage amount:  Copious    Wound treatment:  Wound left open Post-procedure details:    Procedure completion:  Tolerated well, no immediate complications      ____________________________________________   INITIAL IMPRESSION / ASSESSMENT AND PLAN / ED COURSE  Pertinent labs & imaging results that were available during my care of the patient were reviewed by me and considered in my medical decision making (see chart for details).   Patient is 40 year old male presents with paronychia.  See HPI.  Physical exam shows patient to appear stable.  See procedure note for drainage of the paronychia  Due to the large amount of infection patient was placed on Keflex 500 3 times daily.  He is to soak his finger in warm salt water for 10 to 15 minutes 2-3 times daily.  Return emergency department worsening.  Follow-up orthopedics if not improving to 3 days.  States he understands.  Is discharged stable condition.     TERRANCE USERY was evaluated in Emergency Department on 05/21/2020 for the symptoms described in the history of present illness. He was evaluated in the context of the global COVID-19 pandemic, which necessitated consideration that the patient might be at risk for infection with the SARS-CoV-2 virus that causes COVID-19. Institutional protocols and algorithms that pertain to the evaluation of patients at risk for COVID-19 are in a state of rapid change based on information released by regulatory bodies including the CDC and federal and state organizations. These policies and algorithms were followed during the patient's care in the ED.    As part of my medical decision making, I reviewed the following data within the electronic MEDICAL RECORD NUMBER Nursing notes reviewed and incorporated, Old chart reviewed, Radiograph reviewed , Notes from prior ED visits and [place]rolled Substance Database  ____________________________________________   FINAL CLINICAL IMPRESSION(S) / ED DIAGNOSES  Final diagnoses:  Paronychia of left  thumb      NEW MEDICATIONS STARTED DURING THIS VISIT:  New Prescriptions   CEPHALEXIN (KEFLEX) 500 MG CAPSULE    Take 1 capsule (500 mg total) by mouth 3 (three) times daily.   TRAMADOL (ULTRAM) 50 MG TABLET    Take 1 tablet (50 mg total) by mouth every 6 (six) hours as needed.     Note:  This document was prepared using Dragon voice recognition software and may include unintentional dictation errors.    Faythe Ghee, PA-C 05/21/20 1721    Jene Every, MD 05/21/20 [phone]

## 2020-05-21 NOTE — ED Notes (Signed)
Pt states his L thumb started to swell a few days ago, but got noticeably larger this AM. This RN noted swollen L thumb with yellow/green coloring by base of nail at this time. Pt denies any injury. Pt states 10/10 pain

## 2020-05-21 NOTE — ED Triage Notes (Signed)
Patient c/o soreness,swelling and blister on left hand, thumb X3 days

## 2020-05-21 NOTE — ED Notes (Signed)
Pt verbalized understanding of d/c instructions at this time and denies any further questions. Pt ambulatory to lobby at this time, NAD noted, steady gait noted, RR even and unlabored.

## 2021-06-18 IMAGING — CT CT CHEST W/ CM
3 of 5 series · 14 of 36 positions shown, 17 images · IV contrast (omnipaque)
Comparison: None.

CLINICAL DATA: Abdominal pain, status post fall

EXAM:
CT CHEST, ABDOMEN, AND PELVIS WITH CONTRAST
TECHNIQUE: Multidetector CT imaging of the chest, abdomen and pelvis was
performed following the standard protocol during bolus
administration of intravenous contrast.
CONTRAST:  100mL OMNIPAQUE IOHEXOL 300 MG/ML  SOLN

[Series 2: cap with · axial · 0.79mm/px · z∈[-1143,-633]mm · 9 of 128 slices shown, 12 images]
[im 13/128  mediastinal]
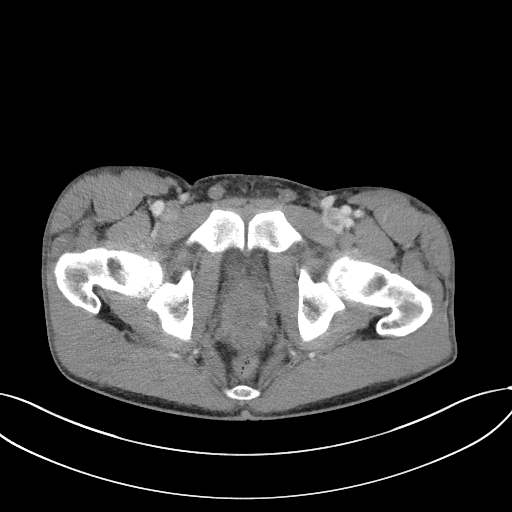
[im 13/128  lung]
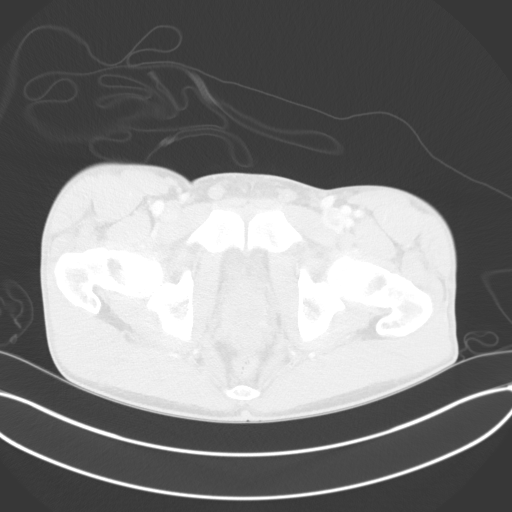
[im 26/128  lung]
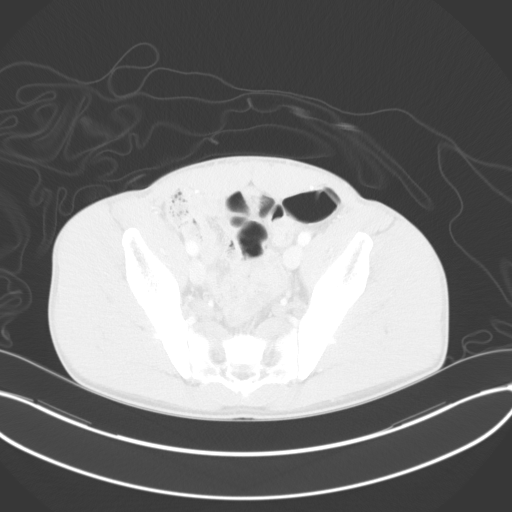
[im 39/128  lung]
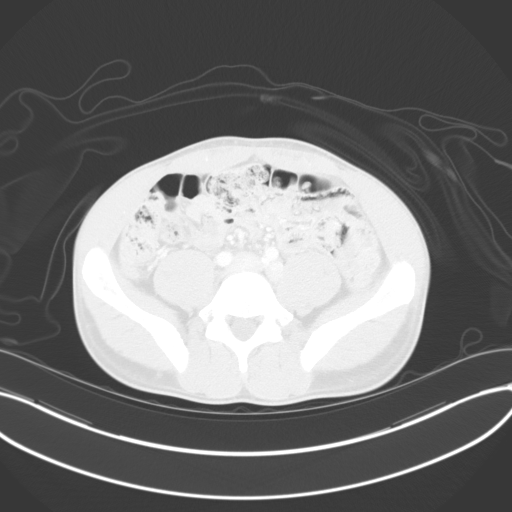
[im 51/128  lung]
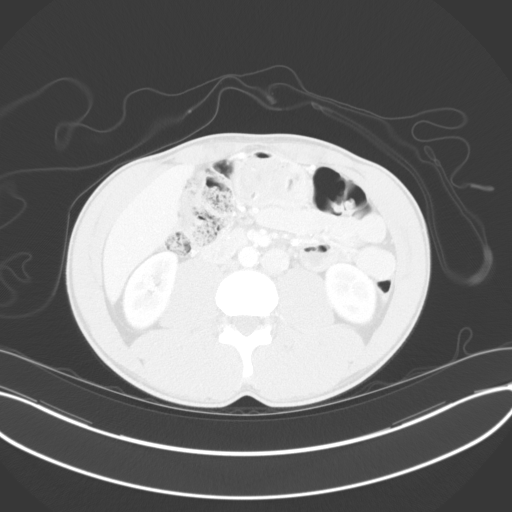
[im 64/128  mediastinal]
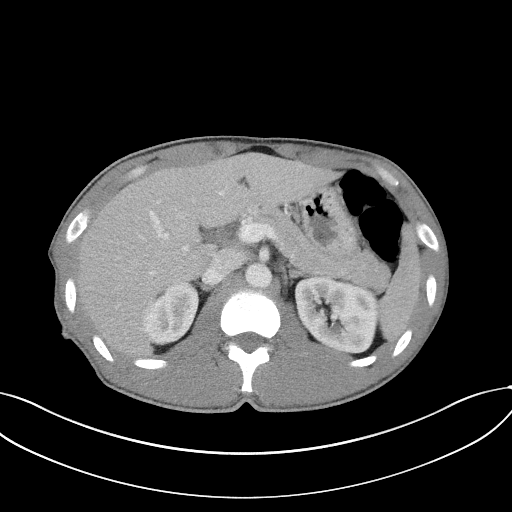
[im 64/128  lung]
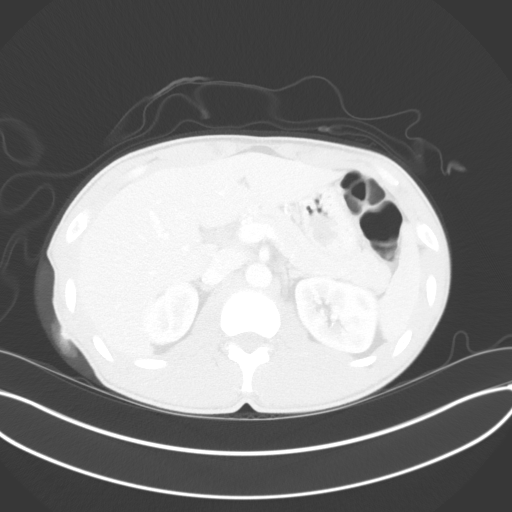
[im 77/128  lung]
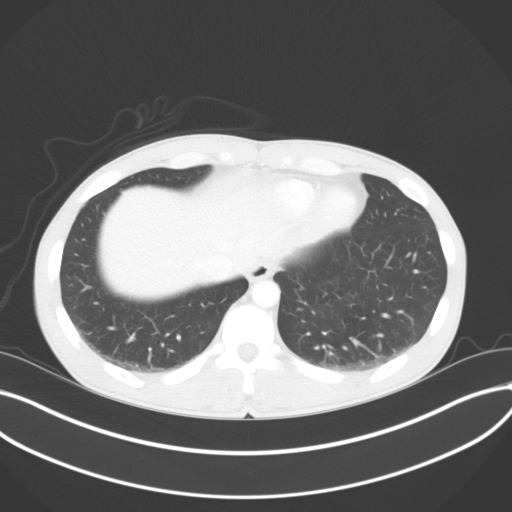
[im 89/128  lung]
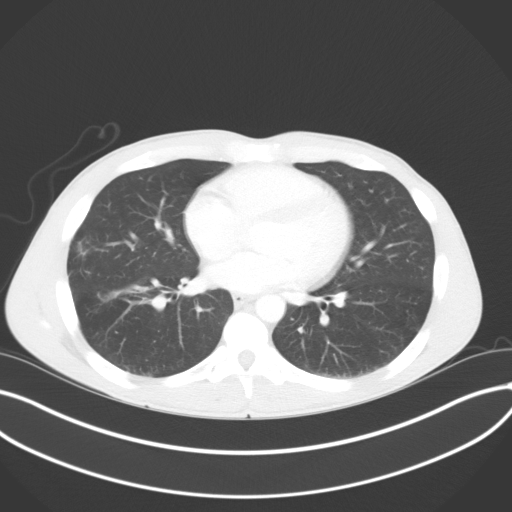
[im 102/128  lung]
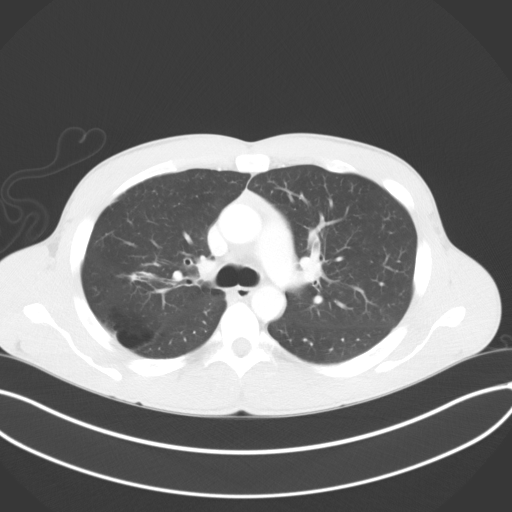
[im 115/128  mediastinal]
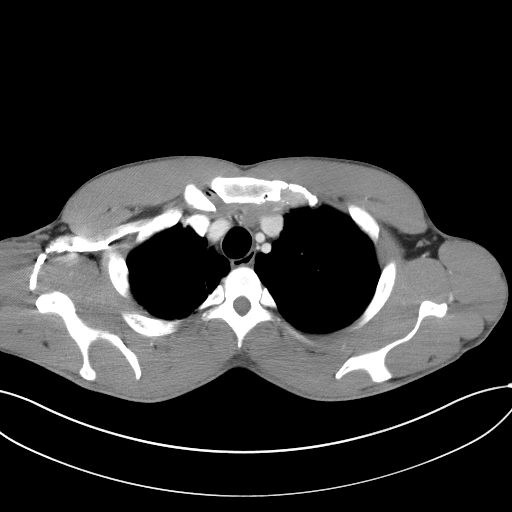
[im 115/128  lung]
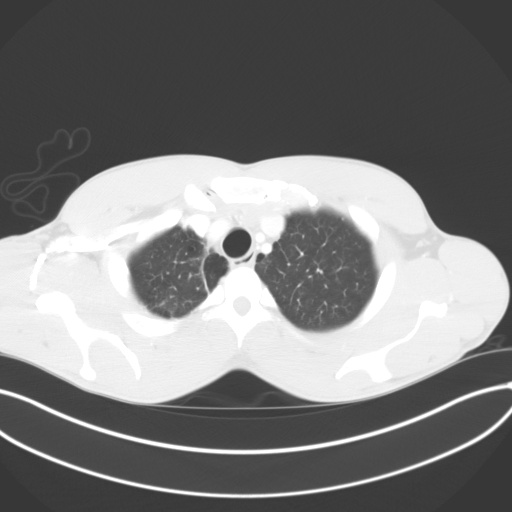

[Series 4: lung · axial · 0.79mm/px · z∈[-866,-816]mm · 2 of 162 slices shown]
[im 13/162  lung]
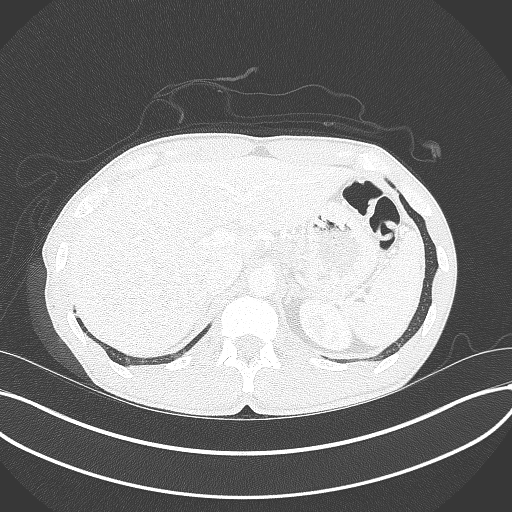
[im 38/162  lung]
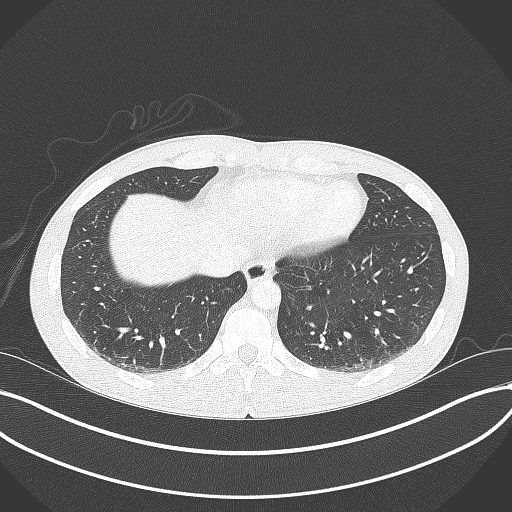

[Series 5: coronals · coronal · 0.82mm/px · 3 of 107 slices shown]
[im 22/107  lung]
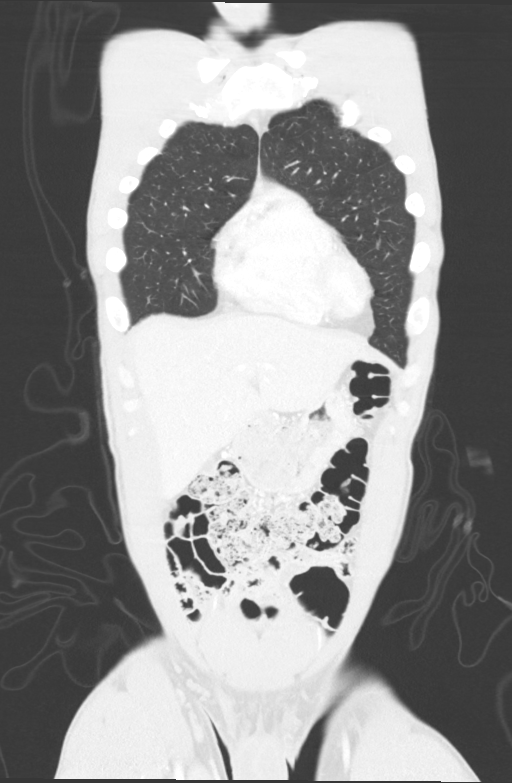
[im 43/107  lung]
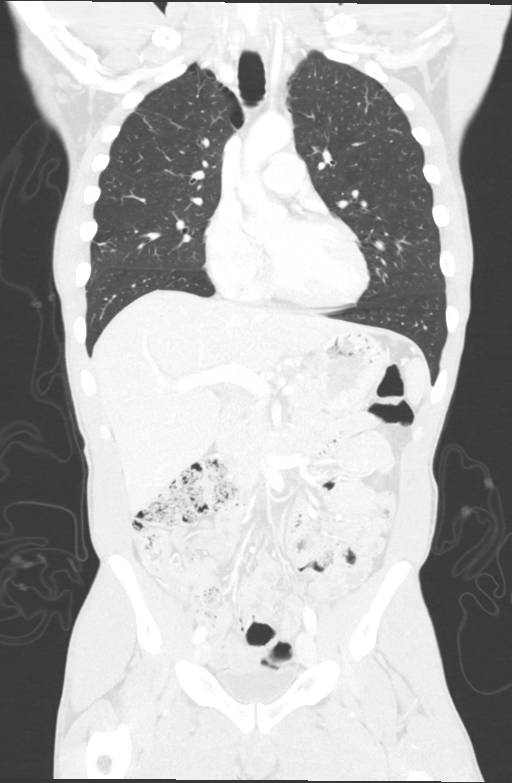
[im 64/107  lung]
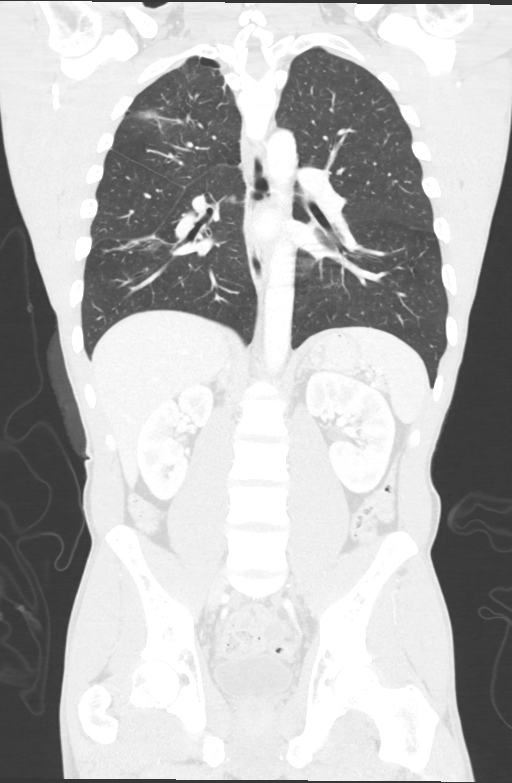

[14 of 36 positions shown; findings below may reference images not displayed]

FINDINGS: CT CHEST FINDINGS

Cardiovascular: No significant vascular findings. Normal heart size.
No pericardial effusion.

Mediastinum/Nodes: No enlarged mediastinal, hilar, or axillary lymph
nodes. Thyroid gland, trachea, and esophagus demonstrate no
significant findings.

Lungs/Pleura: Biapical blebs. Linear band of airspace disease in the
right upper lobe consistent with scarring. No focal consolidation,
pleural effusion or pneumothorax.

Musculoskeletal: No acute osseous abnormality. No aggressive osseous
lesion small amount of air in the sternoclavicular joints
bilaterally likely secondary to mild degenerative changes.

CT ABDOMEN PELVIS FINDINGS

Hepatobiliary: No focal liver abnormality is seen. No gallstones,
gallbladder wall thickening, or biliary dilatation.

Pancreas: Unremarkable. No pancreatic ductal dilatation or
surrounding inflammatory changes.

Spleen: Normal in size without focal abnormality.

Adrenals/Urinary Tract: Adrenal glands are unremarkable. Kidneys are
normal, without renal calculi, focal lesion, or hydronephrosis.
Bladder is unremarkable.

Stomach/Bowel: Stomach is within normal limits. Appendix appears
normal. No evidence of bowel wall thickening, distention, or
inflammatory changes.

Vascular/Lymphatic: No significant vascular findings are present. No
enlarged abdominal or pelvic lymph nodes.

Reproductive: Prostate is unremarkable.

Other: No abdominal wall hernia or abnormality. No abdominopelvic
ascites. Small wound along the right lateral aspect of the lower
chest and upper abdomen without penetration into the thoracic or
abdominal cavity. 3 mm punctate radiopaque foreign body in the skin
along the right lateral upper abdomen.

Musculoskeletal: No acute osseous abnormality. No aggressive osseous
lesion.
IMPRESSION: 1. Small wound along the right lateral aspect of the lower chest and
upper abdomen without penetration into the thoracic or abdominal
cavity. 3 mm punctate radiopaque foreign body in the skin along the
right lateral upper abdomen.
2. Otherwise, no acute injury of the chest, abdomen or pelvis.

## 2023-09-24 ENCOUNTER — Encounter: Payer: Self-pay | Admitting: Emergency Medicine

## 2023-09-24 ENCOUNTER — Emergency Department

## 2023-09-24 ENCOUNTER — Other Ambulatory Visit: Payer: Self-pay

## 2023-09-24 ENCOUNTER — Emergency Department
Admission: EM | Admit: 2023-09-24 | Discharge: 2023-09-24 | Disposition: A | Discharge status: Home / Self Care | Attending: Emergency Medicine | Admitting: Emergency Medicine

## 2023-09-24 DIAGNOSIS — Y92410 Unspecified street and highway as the place of occurrence of the external cause: Secondary | ICD-10-CM | POA: Insufficient documentation

## 2023-09-24 DIAGNOSIS — S39012A Strain of muscle, fascia and tendon of lower back, initial encounter: Secondary | ICD-10-CM | POA: Insufficient documentation

## 2023-09-24 DIAGNOSIS — S199XXA Unspecified injury of neck, initial encounter: Secondary | ICD-10-CM | POA: Diagnosis not present

## 2023-09-24 DIAGNOSIS — S161XXA Strain of muscle, fascia and tendon at neck level, initial encounter: Secondary | ICD-10-CM | POA: Diagnosis not present

## 2023-09-24 DIAGNOSIS — M545 Low back pain, unspecified: Secondary | ICD-10-CM | POA: Diagnosis not present

## 2023-09-24 DIAGNOSIS — Z041 Encounter for examination and observation following transport accident: Secondary | ICD-10-CM | POA: Diagnosis not present

## 2023-09-24 MED ORDER — ACETAMINOPHEN 325 MG PO TABS
650.0000 mg | ORAL_TABLET | Freq: Once | ORAL | Status: AC
  Administered 2023-09-24: 650 mg via ORAL
  Filled 2023-09-24: qty 2

## 2023-09-24 MED ORDER — CYCLOBENZAPRINE HCL 10 MG PO TABS
5.0000 mg | ORAL_TABLET | Freq: Once | ORAL | Status: AC
  Administered 2023-09-24: 5 mg via ORAL
  Filled 2023-09-24: qty 1

## 2023-09-24 MED ORDER — CYCLOBENZAPRINE HCL 5 MG PO TABS: 5.0000 mg | ORAL_TABLET | Freq: Three times a day (TID) | ORAL | 0 refills | Status: AC | PRN

## 2023-09-24 MED ORDER — KETOROLAC TROMETHAMINE 30 MG/ML IJ SOLN
30.0000 mg | Freq: Once | INTRAMUSCULAR | Status: AC
  Administered 2023-09-24: 30 mg via INTRAMUSCULAR
  Filled 2023-09-24: qty 1
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
